# Patient Record
Sex: Female | Born: 2000 | Race: Black or African American | Hispanic: No | Marital: Single
Health system: Midwestern US, Community
[De-identification: ages and names within clinical notes are randomized; demographics above are authoritative.]

## PROBLEM LIST (undated history)

## (undated) DIAGNOSIS — T7840XA Allergy, unspecified, initial encounter: Secondary | ICD-10-CM

## (undated) DIAGNOSIS — M419 Scoliosis, unspecified: Secondary | ICD-10-CM

## (undated) DIAGNOSIS — R636 Underweight: Secondary | ICD-10-CM

---
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
## 2022-11-27 ENCOUNTER — Ambulatory Visit
Admit: 2022-11-27 | Discharge: 2023-01-21 | Payer: PRIVATE HEALTH INSURANCE | Attending: Student in an Organized Health Care Education/Training Program | Primary: Student in an Organized Health Care Education/Training Program

## 2022-11-27 ENCOUNTER — Inpatient Hospital Stay
Admit: 2022-11-27 | Payer: PRIVATE HEALTH INSURANCE | Primary: Student in an Organized Health Care Education/Training Program

## 2022-11-27 DIAGNOSIS — R636 Underweight: Secondary | ICD-10-CM

## 2022-11-27 DIAGNOSIS — Z3009 Encounter for other general counseling and advice on contraception: Secondary | ICD-10-CM

## 2022-11-27 MED ORDER — SYEDA 3-0.03 MG PO TABS
PACK | Freq: Every day | ORAL | 3 refills | Status: DC
Start: 2022-11-27 — End: 2023-03-25

## 2022-11-27 NOTE — Progress Notes (Signed)
Subjective:  Dawn Berry is a 22 y.o. y.o. female who presents for  establishment of care and following.     Wellness exam:  Diet include fruits and vegetables and low on processed foods sodas/juices/high sodium foods: yes  Exercise 150 min per week: yes  Smoking: no  Alcohol abuse: no  Drug use: no  Hx depression/anxiety?  no   Dental exam annual done? No  Vision exam annual done?  No  Desire for birth control? Yes  Desire for STD screen? No        Past medical issues:  Allergies  Always low weight   No hx of blood clots or migraines, tolerated combined birth control for more than 1 year would like refill    Up-to-date  Up-to-date with immunization: unknown  Up-to-date with cancer screens: No, pap smear declined today   Patient has MyChart and/or sent text to phone to set it up?No    ROS:   General: no fever, no weight loss  CV:  no chest pain  Resp: no shortness of breath  AB: no  abdominal pain,    GU: no urinary symptoms, normal bowel movements    No past medical history on file.   Past Surgical History:   Procedure Laterality Date    KNEE SURGERY Left       Social History     Socioeconomic History    Marital status: Single     Spouse name: None    Number of children: None    Years of education: None    Highest education level: None   Tobacco Use    Smoking status: Never     Passive exposure: Never    Smokeless tobacco: Never   Substance and Sexual Activity    Alcohol use: Yes     Comment: ocassionally    Drug use: Never     Social Determinants of Health     Financial Resource Strain: Low Risk  (11/27/2022)    Overall Financial Resource Strain (CARDIA)     Difficulty of Paying Living Expenses: Not hard at all   Food Insecurity: No Food Insecurity (11/27/2022)    Hunger Vital Sign     Worried About Running Out of Food in the Last Year: Never true     [place] in the Last Year: Never true   Transportation Needs: Unknown (11/27/2022)    PRAPARE - Transportation     Lack of Transportation (Non-Medical): No    Housing Stability: Unknown (11/27/2022)    Housing Stability Vital Sign     Unstable Housing in the Last Year: No      Current Outpatient Medications   Medication Sig Dispense Refill    SYEDA 3-0.03 MG TABS Take 1 tablet by mouth daily 1 packet 3     No current facility-administered medications for this visit.      No Known Allergies   No family history on file.         OBJECTIVE  Vitals:    11/27/22 1513   BP: 103/67   Site: Right Upper Arm   Position: Sitting   Cuff Size: Medium Adult   Pulse: 96   Resp: 16   Temp: 97.4 F (36.3 C)   TempSrc: Temporal   SpO2: 98%   Weight: 43.1 kg (95 lb)   Height: 1.549 m (5\' 1" )     Body mass index is 17.95 kg/m.   Wt Readings from Last 3 Encounters:   11/27/22 43.1 kg (  95 lb)     General:  alert, cooperative, well appearing, in no apparent distress.  Eyes:   The conjunctiva is clear and noninjected.  There is no sclera icterus or conjunctival pallor.  CV:  The heart sounds are regular in rate and rhythm.  There is a normal S1 and S2.  There or no murmurs.  Lungs:  No increased work of breathing, Lung sounds are clear and equal to auscultation throughout all lung fields without wheezing, rales, or rhonchi.  GI:  The abdomen is soft with no tenderness.  Bowel sounds are present and normal. There is no rebound or guarding.    Skin: no rashes, no jaundice  Psych: normal affect.               ASSESSMENT / PLAN  Dawn Berry was seen today for establish care.    Diagnoses and all orders for this visit:    Low weight  -     SYEDA 3-0.03 MG TABS; Take 1 tablet by mouth daily  -     CBC with Auto Differential; Future  -     Comprehensive Metabolic Panel; Future  -     Lipid Panel; Future  -     TSH with Reflex; Future    Hepatitis B vaccination status unknown  -     Hepatitis B Surface Antigen; Future  -     Hepatitis B Surface Antibody; Future  -     Hepatitis B Surface Antibody Quant; Future    Wellness examination    Birth control counseling  -     SYEDA 3-0.03 MG TABS; Take 1 tablet by  mouth daily          ANTICIPATORY guidance recommendations. Nutrition, alcohol use and smoking prevention counseling. Exercise recommendations are at least 150 minutes of moderate intensity exercise weekly. Family planning/contraception, and STD prevention. Recommend sunscreen use, safe sun practices to reduce skin cancer risk. Recommend brushing teeth twice a day, regular dental visits. Encourage seat belt use at all times for everyone.       This note was dictated utilizing voice recognition software which may lead to typographical errors.  I apologize in advance if the situation occurs.  If questions arise please do not hesitate to contact me or call our department.    An electronic signature was used to authenticate this note.    --Army Melia, MD

## 2022-11-27 NOTE — Progress Notes (Signed)
Chief Complaint   Patient presents with    Establish Care         1. "Have you been to the ER, urgent care clinic since your last visit?  Hospitalized since your last visit?" No    2. "Have you seen or consulted any other health care providers outside of the [place] since your last visit?" No     3. For patients aged 22-75: Has the patient had a colonoscopy / FIT/ Cologuard? NA - based on age      If the patient is female:    4. For patients aged 98-74: Has the patient had a mammogram within the past 2 years? NA - based on age or sex      37. For patients aged 21-65: Has the patient had a pap smear? No

## 2022-11-28 LAB — CBC WITH AUTO DIFFERENTIAL
Absolute Immature Granulocyte: 0 10*3/uL (ref 0.00–0.04)
Basophils %: 1 % (ref 0–2)
Basophils Absolute: 0.1 10*3/uL (ref 0.0–0.1)
Eosinophils %: 2 % (ref 0–5)
Eosinophils Absolute: 0.2 10*3/uL (ref 0.0–0.4)
Hematocrit: 38 % (ref 35.0–45.0)
Hemoglobin: 12.8 g/dL (ref 12.0–16.0)
Immature Granulocytes: 0 % (ref 0.0–0.5)
Lymphocytes %: 27 % (ref 21–52)
Lymphocytes Absolute: 2.1 10*3/uL (ref 0.9–3.6)
MCH: 30.5 PG (ref 24.0–34.0)
MCHC: 33.7 g/dL (ref 31.0–37.0)
MCV: 90.5 FL (ref 78.0–100.0)
MPV: 9.3 FL (ref 9.2–11.8)
Monocytes %: 5 % (ref 3–10)
Monocytes Absolute: 0.4 10*3/uL (ref 0.05–1.2)
Neutrophils %: 66 % (ref 40–73)
Neutrophils Absolute: 5 10*3/uL (ref 1.8–8.0)
Nucleated RBCs: 0 PER 100 WBC
Platelets: 370 10*3/uL (ref 135–420)
RBC: 4.2 M/uL (ref 4.20–5.30)
RDW: 12.5 % (ref 11.6–14.5)
WBC: 7.7 10*3/uL (ref 4.6–13.2)
nRBC: 0 10*3/uL (ref 0.00–0.01)

## 2022-11-28 LAB — COMPREHENSIVE METABOLIC PANEL
ALT: 44 U/L (ref 13–56)
AST: 30 U/L (ref 10–38)
Albumin/Globulin Ratio: 1.1 (ref 0.8–1.7)
Albumin: 4 g/dL (ref 3.4–5.0)
Alk Phosphatase: 57 U/L (ref 45–117)
Anion Gap: 5 mmol/L (ref 3.0–18)
BUN: 9 MG/DL (ref 7.0–18)
Bun/Cre Ratio: 16 (ref 12–20)
CO2: 26 mmol/L (ref 21–32)
Calcium: 9.6 MG/DL (ref 8.5–10.1)
Chloride: 109 mmol/L (ref 100–111)
Creatinine: 0.55 MG/DL — ABNORMAL LOW (ref 0.6–1.3)
Est, Glom Filt Rate: >60 mL/min/{1.73_m2} (ref >60)
Globulin: 3.5 g/dL (ref 2.0–4.0)
Glucose: 87 mg/dL (ref 74–99)
Potassium: 4.2 mmol/L (ref 3.5–5.5)
Sodium: 140 mmol/L (ref 136–145)
Total Bilirubin: 0.8 MG/DL (ref 0.2–1.0)
Total Protein: 7.5 g/dL (ref 6.4–8.2)

## 2022-11-28 LAB — LIPID PANEL
Chol/HDL Ratio: 2.3 (ref 0–5.0)
Cholesterol, Total: 195 MG/DL (ref <200)
HDL: 85 MG/DL — ABNORMAL HIGH (ref 40–60)
LDL Calculated: 95.2 MG/DL (ref 0–100)
Triglycerides: 74 MG/DL (ref <150)
VLDL Cholesterol Calculated: 14.8 MG/DL

## 2022-11-28 LAB — TSH WITH REFLEX: TSH Cascade Panel: 0.53 u[IU]/mL (ref 0.36–3.74)

## 2022-11-30 LAB — HEPATITIS B SURFACE ANTIBODY
Hep B S Ab Interp: POSITIVE
Hep B S Ab: 28.38 m[IU]/mL (ref >10.0)

## 2022-11-30 LAB — HEPATITIS B SURFACE ANTIGEN
Hep B S Ag Interp: NEGATIVE
Hepatitis B Surface Ag: <0.1 Index (ref <1.00)

## 2022-12-01 LAB — HEPATITIS B SURFACE ANTIBODY QUANT: Hep B S Ab: 29.5 m[IU]/mL (ref >9.9)

## 2023-03-24 ENCOUNTER — Encounter

## 2023-03-24 NOTE — Telephone Encounter (Signed)
Last appointment: 11/27/2022    Next appointment: none    Pharmacy requesting refill for     DROSPIRENONE-ETHINYL 3-0.03MG  T [address]    Pharmacy     [id number] - [address] [place] - [address] - P [phone] - F [phone] (Pharmacy)

## 2023-03-25 MED ORDER — SYEDA 3-0.03 MG PO TABS
3-0.03 | PACK | Freq: Every day | ORAL | 3 refills | Status: DC
Start: 2023-03-25 — End: 2024-10-02

## 2023-03-25 NOTE — Telephone Encounter (Signed)
Medication refill per verbal order Dr. Emnet Alemu

## 2023-09-21 NOTE — Telephone Encounter (Signed)
 Tried reaching patient to schedule wellness visit. Patient last seen 11/27/2022. Patient stated she would call office back to schedule appt.

## 2024-09-29 ENCOUNTER — Ambulatory Visit
Admit: 2024-09-29 | Discharge: 2024-10-04 | Payer: BLUE CROSS/BLUE SHIELD | Attending: Family | Primary: Student in an Organized Health Care Education/Training Program

## 2024-09-29 MED ORDER — HYDROXYZINE HCL 50 MG PO TABS
50 | ORAL_TABLET | Freq: Every evening | ORAL | 1 refills | 30.00000 days | Status: AC | PRN
Start: 2024-09-29 — End: ?

## 2024-09-29 NOTE — Progress Notes (Signed)
 Have you been to the ER, urgent care clinic since your last visit?  Hospitalized since your last visit?   NO    Have you seen or consulted any other health care providers outside our system since your last visit?   NO     Have you had a pap smear?    NO     No cervical cancer screening on file              Patient states when falling asleep she feels as though her heart is beating fast and also sometimes feels like she has heart flutters.

## 2024-09-29 NOTE — Progress Notes (Addendum)
 General Office Visit Note    Assessment/Plan:   orders and follow up as documented in EMR    1. Heart palpitations  Comments:  Patient instructed to decrease caffeine intake.  Orders:  -     EKG 12 Lead  2. Screening for deficiency anemia  -     CBC with Auto Differential; Future  3. Screening for cholesterol level  -     Lipid Panel; Future  4. Screening for diabetes mellitus  -     Hemoglobin A1C; Future  5. Encounter for hepatitis C screening test for low risk patient  -     Hepatitis C Ab, Rflx to Qt by PCR; Future  6. Screening for metabolic disorder  -     Comprehensive Metabolic Panel; Future  7. Screening for thyroid disorder  -     TSH; Future  8. Anxiety and depression  -     hydrOXYzine  HCl (ATARAX ) 50 MG tablet; Take 1 tablet by mouth nightly as needed for Anxiety, Disp-90 tablet, R-1Normal  9. Screening for STDs (sexually transmitted diseases)  -     Chlamydia, Gonorrhea, Trichomoniasis; Future  -     Hepatitis B surface antigen confirmation reflex; Future  -     HIV 1/2 Ag/Ab, 4TH Generation,W Rflx Confirm; Future  -     RPR; Future  -     T. pallidum Ab; Future  -     Treponema Pallidum AB TP-PA; Future     Follow-up and Dispositions    Return in 1 month (on 10/30/2024) for Anxiety and Depression, Palpitations, In Office (ONLY), Fasting Labs 1 Wk Prior.         Subjective:     Dawn Berry is a 24 y.o. y.o. female who complains of   Chief Complaint   Patient presents with    Tachycardia     Palpitations  Patient complains of a fluttering sensation and palpitations.  The symptoms are moderate in severity, occurring at bedtime and lasting 10 minutes per episode since Tuesday. Cardiac risk factors include: none. Aggravating factors: none.  Alleviating factors: spontaneous.  Associated symptoms: none.  Patient denies: chest pain, chest pressure/discomfort, dyspnea, lower extremity edema, and tachypnea. Pt does work at [employer] and drinks 8 oz of regular coffee, but does have a 6 oz cup with 3 shots of espresso  about 3 day a week.      History reviewed. No pertinent past medical history.   Past Surgical History:   Procedure Laterality Date    KNEE SURGERY Left       Social History     Socioeconomic History    Marital status: Single     Spouse name: None    Number of children: None    Years of education: None    Highest education level: None   Tobacco Use    Smoking status: Never     Passive exposure: Never    Smokeless tobacco: Never   Vaping Use    Vaping status: Former   Substance and Sexual Activity    Alcohol use: Yes     Comment: ocassionally    Drug use: Never     Social Drivers of Psychologist, [employer] Strain: Low Risk  (11/27/2022)    Overall Financial Resource Strain (CARDIA)     Difficulty of Paying Living Expenses: Not hard at all   Food Insecurity: No Food Insecurity (09/29/2024)    Hunger Vital Sign     Worried  About Running Out of Food in the Last Year: Never true     Ran Out of Food in the Last Year: Never true   Transportation Needs: No Transportation Needs (09/29/2024)    PRAPARE - Therapist, Art (Medical): No     Lack of Transportation (Non-Medical): No   Physical Activity: Inactive (09/29/2024)    Exercise Vital Sign     Days of Exercise per Week: 0 days     Minutes of Exercise per Session: 0 min   Housing Stability: Low Risk  (09/29/2024)    Housing Stability Vital Sign     Unable to Pay for Housing in the Last Year: No     Number of Times Moved in the Last Year: 0     Homeless in the Last Year: No      Current Outpatient Medications   Medication Sig Dispense Refill    hydrOXYzine  HCl (ATARAX ) 50 MG tablet Take 1 tablet by mouth nightly as needed for Anxiety 90 tablet 1     No current facility-administered medications for this visit.      No Known Allergies   History reviewed. No pertinent family history.     REVIEW OF SYSTEMS  Review of Systems    Objective:     BP 120/72 (BP Site: Left Upper Arm, Patient Position: Sitting, BP Cuff Size: Medium Adult)   Pulse 78   Resp  16   Ht 1.549 m (5' 1)   Wt 45.8 kg (101 lb)   LMP 09/13/2024 (Exact Date)   SpO2 98%   BMI 19.08 kg/m    Current Outpatient Medications   Medication Instructions    hydrOXYzine  HCl (ATARAX ) 50 mg, Oral, BEDTIME PRN        No results for input(s): WBC, HGB, HCT, MCV, PLT in the last 720 hours.   Lab Results   Component Value Date    NA 140 11/27/2022    K 4.2 11/27/2022    CL 109 11/27/2022    CO2 26 11/27/2022    BUN 9 11/27/2022    CREATININE 0.55 (L) 11/27/2022    GLUCOSE 87 11/27/2022    CALCIUM 9.6 11/27/2022    BILITOT 0.8 11/27/2022    ALKPHOS 57 11/27/2022    AST 30 11/27/2022    ALT 44 11/27/2022    LABGLOM >60 11/27/2022    GLOB 3.5 11/27/2022     Lab Results   Component Value Date/Time    NA 140 11/27/2022 03:52 PM    K 4.2 11/27/2022 03:52 PM    CL 109 11/27/2022 03:52 PM    CO2 26 11/27/2022 03:52 PM    BUN 9 11/27/2022 03:52 PM    CREATININE 0.55 11/27/2022 03:52 PM    GLUCOSE 87 11/27/2022 03:52 PM    CALCIUM 9.6 11/27/2022 03:52 PM    LABGLOM >60 11/27/2022 03:52 PM       No results found for: TSH, TSHFT4, TSHELE, TSH3GEN, TSHHS   Lab Results   Component Value Date    CHOL 195 11/27/2022    TRIG 74 11/27/2022    HDL 85 (H) 11/27/2022    LDL 95.2 11/27/2022    VLDL 14.8 11/27/2022    CHOLHDLRATIO 2.3 11/27/2022      No results found for: LABA1C, HBA1CPOC   No results found for: ALBCREAT     EKG: Rate=78, Rhythm= Sinus Rhythm, PR intervals=39    PHYSICAL EXAM  Physical Exam  Vitals and nursing note  reviewed.   Constitutional:       Appearance: Normal appearance.   Cardiovascular:      Rate and Rhythm: Normal rate.      Heart sounds: Normal heart sounds.   Pulmonary:      Effort: Pulmonary effort is normal.      Breath sounds: Normal breath sounds.   Musculoskeletal:         General: Normal range of motion.      Cervical back: Normal range of motion and neck supple.   Skin:     General: Skin is warm and dry.   Neurological:      General: No focal deficit present.       Mental Status: She is alert and oriented to person, place, and time.   Psychiatric:         Mood and Affect: Mood normal.         Behavior: Behavior normal.           On this date 09/29/2024 I have spent 30 minutes reviewing previous notes, test results and face to face with the patient discussing the diagnosis and importance of compliance with the treatment plan as well as documenting on the day of the visit.     Disclaimer:    The patient understands our medical plan.  Alternatives have been explained and offered.  The risks, benefits and significant side effects of all medications have been reviewed. Anticipated time course and progression of condition reviewed. All questions have been addressed.  She is encouraged to employ the information provided in the after visit summary, which was reviewed.      Where applicable, she is instructed to call the clinic if she has not been notified either by phone or through MyChart with the results of her tests/labs or with an appointment plan for any referrals within 1 week(s). The patient is to call if her condition worsens or fails to improve or if significant side effects are experienced.     Please note that this dictation was completed with Dragon, the computer voice recognition software. Quite often unanticipated grammatical, syntax, homophones, and other interpretive errors are inadvertently transcribed by the computer software. Please disregard these errors. Please excuse any errors that have escaped final proofreading.    Alfonso DELENA Birmingham, APRN - NP     10/02/2024
